# Patient Record
Sex: Male | Born: 1970 | Hispanic: No | Marital: Married | Smoking: Never smoker
Health system: Southern US, Community
[De-identification: ages and names within clinical notes are randomized; demographics above are authoritative.]

## PROBLEM LIST (undated history)

## (undated) DIAGNOSIS — G47419 Narcolepsy without cataplexy: Secondary | ICD-10-CM

---

## 2019-12-18 ENCOUNTER — Other Ambulatory Visit: Payer: Self-pay | Admitting: Physician Assistant

## 2019-12-18 DIAGNOSIS — M5413 Radiculopathy, cervicothoracic region: Secondary | ICD-10-CM

## 2019-12-30 ENCOUNTER — Other Ambulatory Visit: Payer: Self-pay

## 2019-12-30 ENCOUNTER — Ambulatory Visit
Admission: RE | Admit: 2019-12-30 | Discharge: 2019-12-30 | Disposition: A | Discharge status: Home / Self Care | Payer: BC Managed Care – PPO | Source: Ambulatory Visit | Attending: Physician Assistant | Admitting: Physician Assistant

## 2019-12-30 DIAGNOSIS — M5413 Radiculopathy, cervicothoracic region: Secondary | ICD-10-CM | POA: Diagnosis not present

## 2020-12-13 IMAGING — MR MR CERVICAL SPINE W/O CM
5 series · 39 of 48 positions shown · non-contrast
Comparison: None.

CLINICAL DATA: Radiculopathy of cervicothoracic

EXAM:
MRI CERVICAL SPINE WITHOUT CONTRAST
TECHNIQUE: Multiplanar, multisequence MR imaging of the cervical spine was
performed. No intravenous contrast was administered.

[Series 5: T2 · sagittal · 3.0mm · 0.62mm/px · 6 of 15 slices shown (1 of 2)]
[im 1/15]
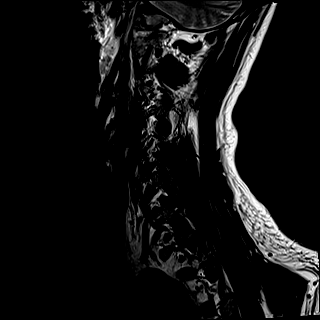
[im 3/15]
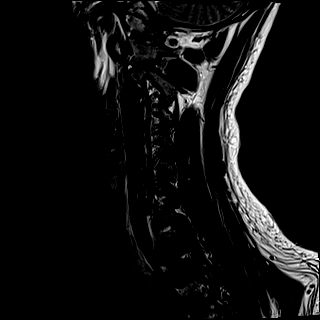
[im 6/15]
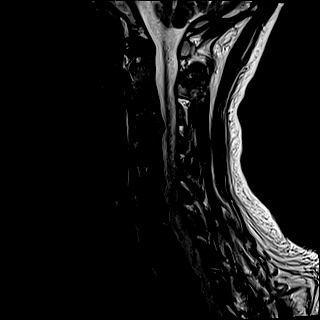
[im 9/15]
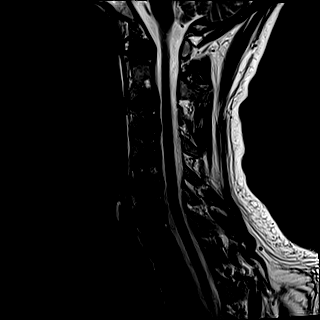
[im 12/15]
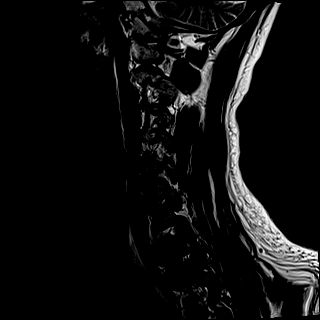
[im 15/15]
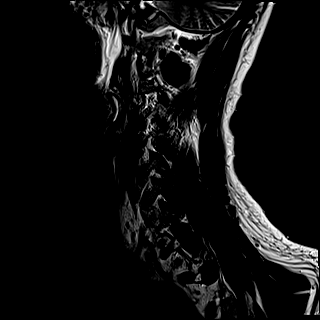

[Series 6: FLAIR · sagittal · 3.0mm · 0.78mm/px · 7 of 15 slices shown]
[im 1/15]
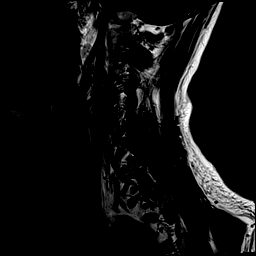
[im 3/15]
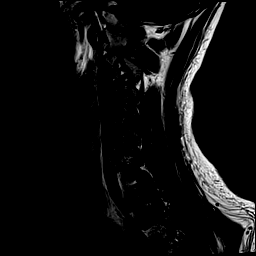
[im 5/15]
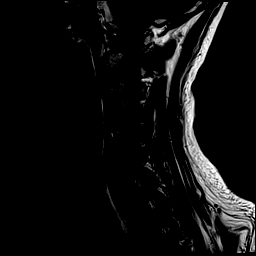
[im 8/15]
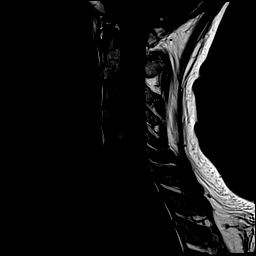
[im 10/15]
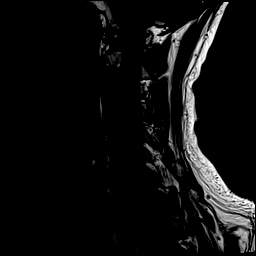
[im 12/15]
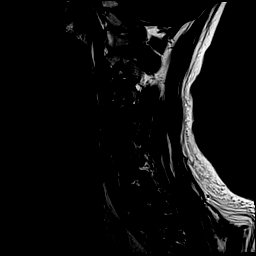
[im 15/15]
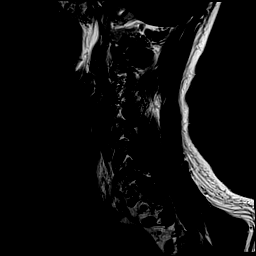

[Series 7: STIR · sagittal · 3.0mm · 0.62mm/px · 7 of 15 slices shown]
[im 1/15]
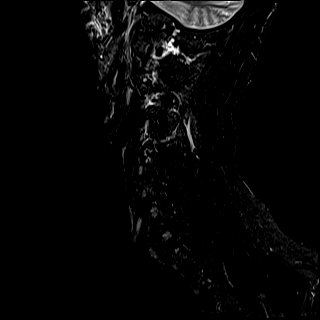
[im 3/15]
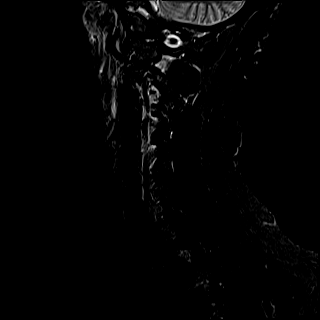
[im 5/15]
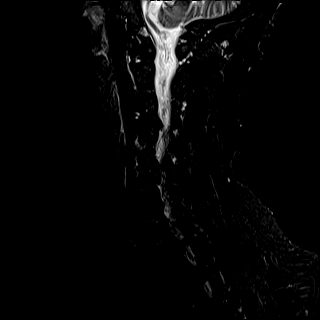
[im 8/15]
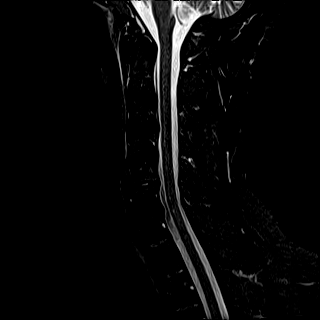
[im 10/15]
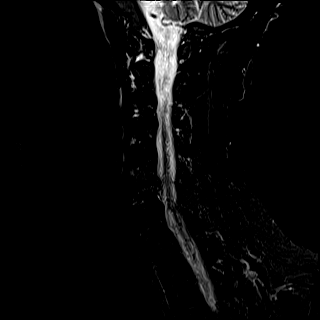
[im 12/15]
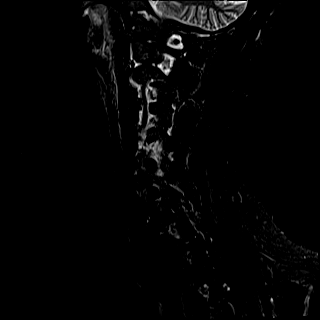
[im 15/15]
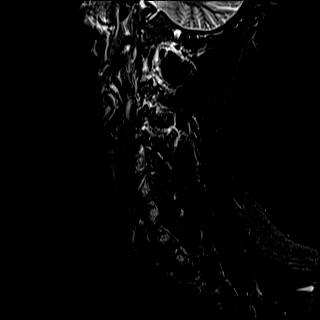

[Series 8: T2 · axial · 3.0mm · 0.70mm/px · z∈[-123,-16]mm · 11 of 32 slices shown (2 of 2)]
[im 1/32]
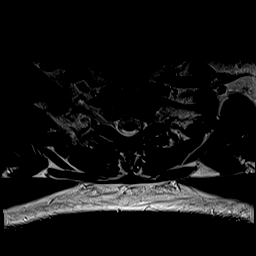
[im 3/32]
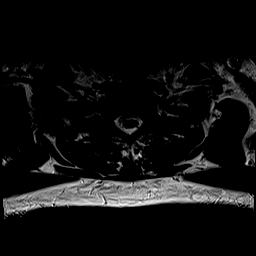
[im 5/32]
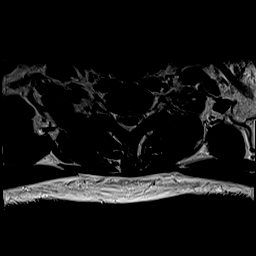
[im 8/32]
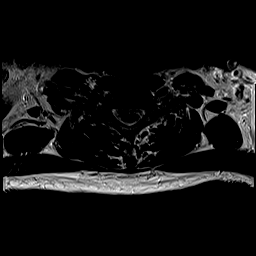
[im 10/32]
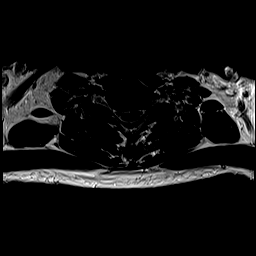
[im 12/32]
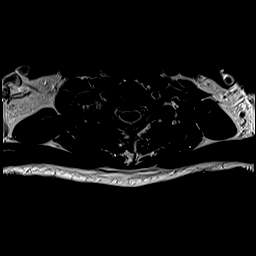
[im 15/32]
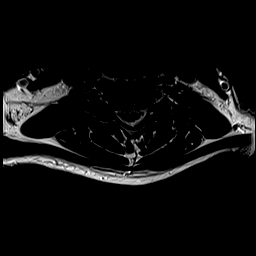
[im 17/32]
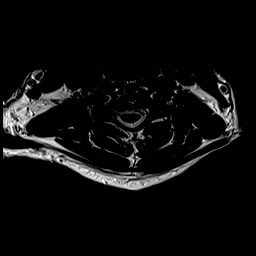
[im 22/32]
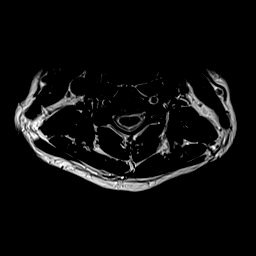
[im 27/32]
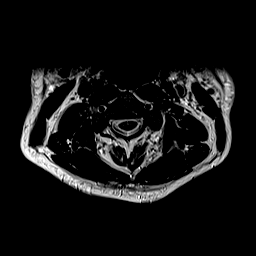
[im 32/32]
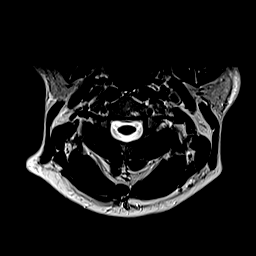

[Series 9: ax mpgr · axial · 3.0mm · 0.35mm/px · z∈[-123,-16]mm · 8 of 32 slices shown]
[im 1/32]
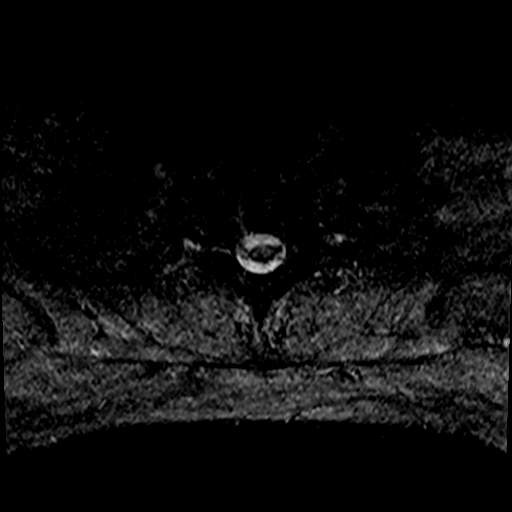
[im 5/32]
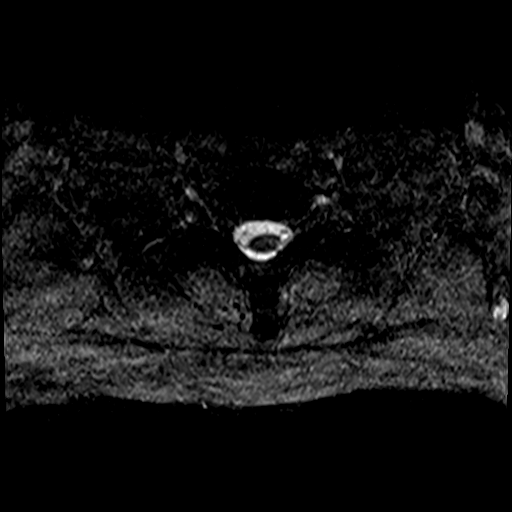
[im 10/32]
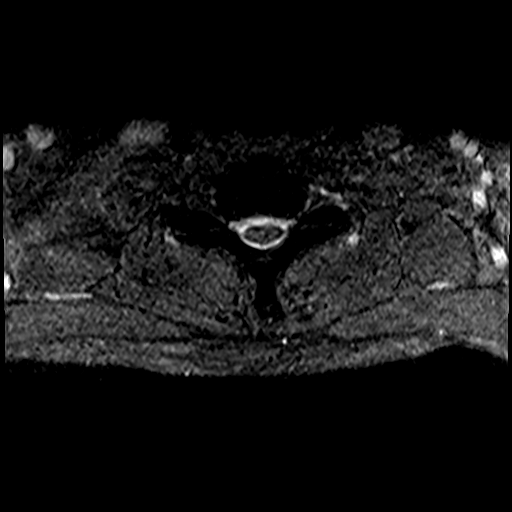
[im 15/32]
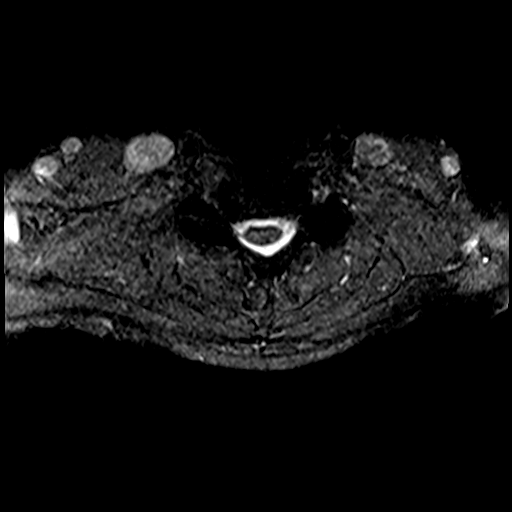
[im 17/32]
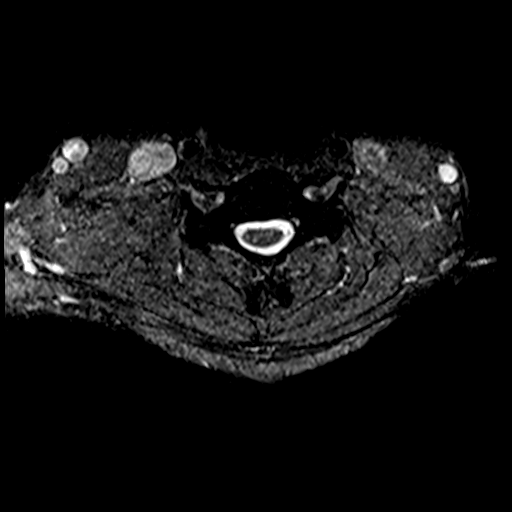
[im 22/32]
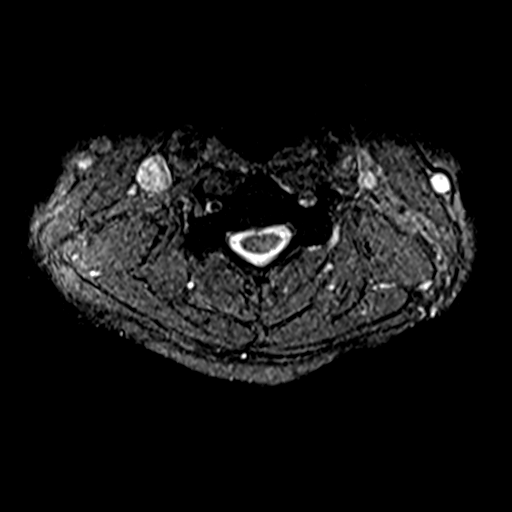
[im 27/32]
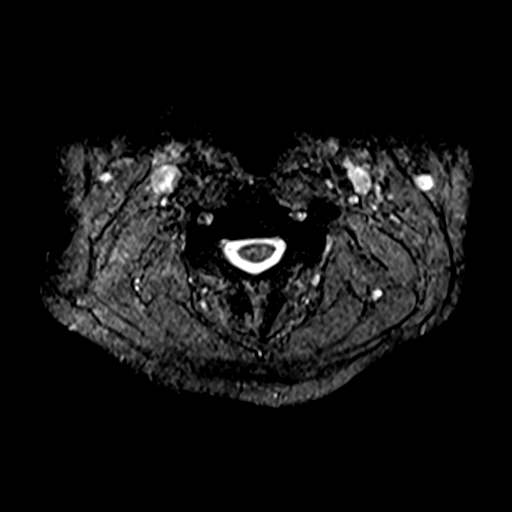
[im 32/32]
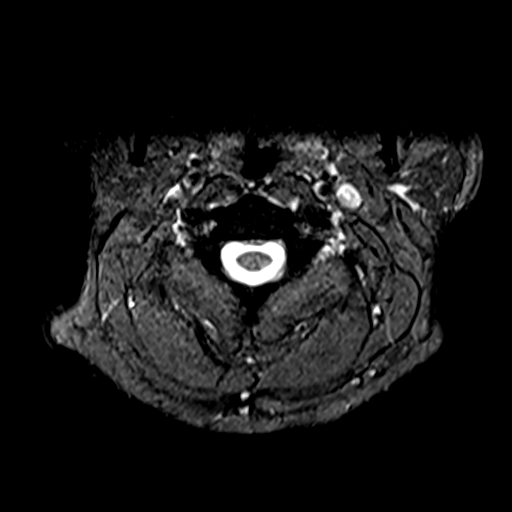

[39 of 48 positions shown; findings below may reference images not displayed]

FINDINGS: Alignment: Physiologic.

Vertebrae: No fracture, evidence of discitis, or bone lesion.

Cord: Normal signal and morphology.

Posterior Fossa, vertebral arteries, paraspinal tissues: Negative.

Disc levels:

C2-3: No spinal canal or neural foraminal stenosis.

C3-4: Small posterior disc osteophyte complex causing indentation of
the thecal sac without significant spinal canal stenosis.
Uncovertebral and facet degenerative changes resulting in
mild-to-moderate right and mild left neural foraminal narrowing.

C4-5: Mild facet and uncovertebral degenerative changes without
significant spinal canal or neural foraminal stenosis.

C5-6: Loss of disc height, small posterior disc osteophyte complex
without significant spinal canal stenosis. Uncovertebral and facet
degenerative changes resulting moderate to severe bilateral neural
foraminal narrowing.

C6-7: Small posterior disc protrusion without significant spinal
canal stenosis. Uncovertebral facet degenerative changes resulting
in moderate bilateral neural foraminal narrowing.

C7-T1: No spinal canal or neural stenosis.
IMPRESSION: 1. Mild multilevel degenerative changes of the cervical spine, as
described above.
2. Moderate-to-severe bilateral neural foraminal narrowing at C5-6
and moderate bilateral neural foraminal narrowing at C6-7.
3. No significant spinal canal stenosis of the cervical spine.

## 2021-09-03 ENCOUNTER — Other Ambulatory Visit: Payer: Self-pay

## 2021-09-03 ENCOUNTER — Emergency Department
Admission: EM | Admit: 2021-09-03 | Discharge: 2021-09-03 | Disposition: A | Discharge status: Home / Self Care | Payer: BC Managed Care – PPO | Attending: Emergency Medicine | Admitting: Emergency Medicine

## 2021-09-03 ENCOUNTER — Encounter: Payer: Self-pay | Admitting: Emergency Medicine

## 2021-09-03 DIAGNOSIS — R002 Palpitations: Secondary | ICD-10-CM

## 2021-09-03 DIAGNOSIS — I10 Essential (primary) hypertension: Secondary | ICD-10-CM | POA: Insufficient documentation

## 2021-09-03 DIAGNOSIS — G4700 Insomnia, unspecified: Secondary | ICD-10-CM | POA: Insufficient documentation

## 2021-09-03 HISTORY — DX: Narcolepsy without cataplexy: G47.419

## 2021-09-03 NOTE — ED Provider Notes (Signed)
[facility] Provider Note    Event Date/Time   First MD Initiated Contact with Patient 09/03/21 1638     (approximate)   History   Palpitations and Insomnia (/)   HPI  Shane Hunter is a 51 y.o. male with past medical history of narcolepsy and ADHD presents for evaluation of palpitations insomnia and hypertension.  Patient normally takes dextroamphetamine salts for his narcolepsy.  Has been out of it for the last 2 weeks, and then about a week ago was started on amphetamine for his narcolepsy which is not normally what he takes due to a Scientist, clinical (histocompatibility and immunogenetics).  He did not feel like it was working as much so he started taking calcium carbonate as well which he thinks increased the absorption.  For several days he had little sleep and palpitations.  He also takes guanfacine for his ADHD, ran out of that several days ago, well as Vyvanse.  After not sleeping for several days he then had a sleep attack at work.  He also felt like his blood pressure was elevated.  Has been drinking a significant amount of caffeine as well which is new for him.  Endorses some intermittent palpitation but denies chest pain.  Denies headache visual change numbness tingling weakness chest pain or shortness of breath.  [facility] they told him to come to the emergency department for concern for serotonin syndrome.  Patient denies other drug or alcohol use.  His neurologist with [facility] is the one who prescribes him his medications.    Past Medical History:  Diagnosis Date   Narcolepsy     There are no problems to display for this patient.    Physical Exam  Triage Vital Signs: ED Triage Vitals  Enc Vitals Group     BP 09/03/21 1620 (!) 171/119     Pulse Rate 09/03/21 1620 (!) 101     Resp 09/03/21 1620 18     Temp 09/03/21 1620 98.1 F (36.7 C)     Temp Source 09/03/21 1620 Oral     SpO2 09/03/21 1620 98 %     Weight 09/03/21 1628  170 lb (77.1 kg)     Height 09/03/21 1628 6\' 1"  (1.854 m)     Head Circumference --      Peak Flow --      Pain Score --      Pain Loc --      Pain Edu? --      Excl. in GC? --     Most recent vital signs: Vitals:   09/03/21 1620  BP: (!) 171/119  Pulse: (!) 101  Resp: 18  Temp: 98.1 F (36.7 C)  SpO2: 98%     General: Awake, no distress.  CV:  Good peripheral perfusion. No edema Resp:  Normal effort. clear Abd:  No distention.  Neuro:             Awake, Alert, Oriented x 3  Other:  No lower extremity clonus   ED Results / Procedures / Treatments  Labs (all labs ordered are listed, but only abnormal results are displayed) Labs Reviewed - No data to display   EKG  EKG interpretation performed by myself: NSR, nml axis, nml intervals, no acute ischemic changes    RADIOLOGY    PROCEDURES:  Critical Care performed: No  .1-3 Lead EKG Interpretation Performed by: 09/05/21, MD Authorized by: Georga Hacking, MD  Interpretation: normal     ECG rate assessment: normal     Rhythm: sinus rhythm     Ectopy: none     Conduction: normal    The patient is on the cardiac monitor to evaluate for evidence of arrhythmia and/or significant heart rate changes.   MEDICATIONS ORDERED IN ED: Medications - No data to display   IMPRESSION / MDM / ASSESSMENT AND PLAN / ED COURSE  I reviewed the triage vital signs and the nursing notes.                              Differential diagnosis includes, but is not limited to, whitecoat hypertension, medication side effect, asymptomatic hypertension, primary hypertension  Patient is a 51 year old male with narcolepsy and ADHD he was on dextroamphetamine's, Vyvanse guanfacine and Klonopin who presents for evaluation of palpitations and insomnia, seen at urgent care and sent to the ED due to evaluation for serotonin syndrome.  Patient has had his medications changed recently and has also been drinking significant  amount of caffeine which is new for him.  Been having palpitations and insomnia over the last several days but denies headache chest pain visual change or altered mental status.  Does not carry a diagnosis of pretension but does tell me he has had whitecoat hypertension in the past.  He is hypertensive blood pressure 170s over 100s, heart rate within normal limits and on EKG he is in sinus rhythm.  Patient is alert and oriented not confused he has no fever and he has no lower extremity clonus.  My suspicion for serotonin syndrome is exceedingly low.  I suspect that his palpitations are in the setting of the multiple amphetamine like medications he is taking as well as the caffeine on top of this.  Seems to have been triggered by the switch in his amphetamine from dextroamphetamine to amphetamine salt.  I recommended that he follow-up with his neurologist regarding these medications and cut back on the caffeine.  We will not start an antihypertensive at this time because unclear what his blood pressure will do once he is back at his baseline.  We discussed getting a blood pressure cuff so that he can check this once a day for the next 2 weeks and then follow-up with the primary care provider to see if he needs to be started on hypertension medication.      FINAL CLINICAL IMPRESSION(S) / ED DIAGNOSES   Final diagnoses:  Hypertension, unspecified type  Palpitations     Rx / DC Orders   ED Discharge Orders     None        Note:  This document was prepared using Dragon voice recognition software and may include unintentional dictation errors.   Georga Hacking, MD 09/03/21 [phone]

## 2021-09-03 NOTE — ED Triage Notes (Signed)
Pt via POV from home. Pt was sent over from [facility]. Pt c/o insomnia and palpitations. {T has been off dextroamphetamine salts for narcolepsy for 2 weeks. Pt states he was told to take Adderall instead. Pt was sent over for possible serotonin syndrome. Pt is A&OX4 and NAD

## 2021-09-03 NOTE — Discharge Instructions (Addendum)
Please check your blood pressure once a day for the next 2 weeks with a blood pressure cuff at home.  Please follow-up with your neurologist guarding your medications.  Please try to cut back on the caffeine as this is likely contributing to the elevated blood pressure and palpitations.
